# Patient Record
Sex: Female | Born: 1970 | Race: White | Hispanic: No | Marital: Married | Smoking: Never smoker
Health system: Southern US, Community
[De-identification: ages and names within clinical notes are randomized; demographics above are authoritative.]

## PROBLEM LIST (undated history)

## (undated) DIAGNOSIS — I341 Nonrheumatic mitral (valve) prolapse: Secondary | ICD-10-CM

## (undated) DIAGNOSIS — I10 Essential (primary) hypertension: Secondary | ICD-10-CM

## (undated) DIAGNOSIS — F419 Anxiety disorder, unspecified: Secondary | ICD-10-CM

---

## 2004-01-26 ENCOUNTER — Emergency Department (HOSPITAL_COMMUNITY): Admission: EM | Admit: 2004-01-26 | Discharge: 2004-01-26 | Payer: Self-pay | Admitting: Emergency Medicine

## 2011-12-21 ENCOUNTER — Emergency Department (HOSPITAL_COMMUNITY)
Admission: EM | Admit: 2011-12-21 | Discharge: 2011-12-21 | Disposition: A | Discharge status: Home / Self Care | Payer: BC Managed Care – PPO | Attending: Emergency Medicine | Admitting: Emergency Medicine

## 2011-12-21 ENCOUNTER — Encounter (HOSPITAL_COMMUNITY): Payer: Self-pay | Admitting: *Deleted

## 2011-12-21 DIAGNOSIS — I10 Essential (primary) hypertension: Secondary | ICD-10-CM | POA: Insufficient documentation

## 2011-12-21 DIAGNOSIS — R110 Nausea: Secondary | ICD-10-CM | POA: Insufficient documentation

## 2011-12-21 DIAGNOSIS — R000 Tachycardia, unspecified: Secondary | ICD-10-CM | POA: Insufficient documentation

## 2011-12-21 DIAGNOSIS — F419 Anxiety disorder, unspecified: Secondary | ICD-10-CM

## 2011-12-21 DIAGNOSIS — F411 Generalized anxiety disorder: Secondary | ICD-10-CM | POA: Insufficient documentation

## 2011-12-21 DIAGNOSIS — I059 Rheumatic mitral valve disease, unspecified: Secondary | ICD-10-CM | POA: Insufficient documentation

## 2011-12-21 HISTORY — DX: Nonrheumatic mitral (valve) prolapse: I34.1

## 2011-12-21 HISTORY — DX: Essential (primary) hypertension: I10

## 2011-12-21 HISTORY — DX: Anxiety disorder, unspecified: F41.9

## 2011-12-21 LAB — COMPREHENSIVE METABOLIC PANEL
AST: 18 U/L (ref 0–37)
Albumin: 3.7 g/dL (ref 3.5–5.2)
BUN: 7 mg/dL (ref 6–23)
Calcium: 9.1 mg/dL (ref 8.4–10.5)
Chloride: 102 mEq/L (ref 96–112)
Creatinine, Ser: 0.64 mg/dL (ref 0.50–1.10)
Total Bilirubin: 0.5 mg/dL (ref 0.3–1.2)
Total Protein: 7.1 g/dL (ref 6.0–8.3)

## 2011-12-21 LAB — LIPASE, BLOOD: Lipase: 14 U/L (ref 11–59)

## 2011-12-21 LAB — DIFFERENTIAL
Basophils Absolute: 0 10*3/uL (ref 0.0–0.1)
Basophils Relative: 1 % (ref 0–1)
Eosinophils Absolute: 0 10*3/uL (ref 0.0–0.7)
Eosinophils Relative: 1 % (ref 0–5)
Monocytes Absolute: 0.2 10*3/uL (ref 0.1–1.0)
Monocytes Relative: 5 % (ref 3–12)
Neutro Abs: 3.3 10*3/uL (ref 1.7–7.7)

## 2011-12-21 LAB — URINALYSIS, ROUTINE W REFLEX MICROSCOPIC
Glucose, UA: NEGATIVE mg/dL
Ketones, ur: NEGATIVE mg/dL
Nitrite: NEGATIVE
Specific Gravity, Urine: 1.005 (ref 1.005–1.030)
pH: 7.5 (ref 5.0–8.0)

## 2011-12-21 LAB — CBC
HCT: 43.5 % (ref 36.0–46.0)
Hemoglobin: 15.9 g/dL — ABNORMAL HIGH (ref 12.0–15.0)
MCH: 32.3 pg (ref 26.0–34.0)
MCHC: 36.6 g/dL — ABNORMAL HIGH (ref 30.0–36.0)
RDW: 11.9 % (ref 11.5–15.5)

## 2011-12-21 LAB — POCT PREGNANCY, URINE: Preg Test, Ur: NEGATIVE

## 2011-12-21 LAB — URINE MICROSCOPIC-ADD ON

## 2011-12-21 MED ORDER — LORAZEPAM 1 MG PO TABS
1.0000 mg | ORAL_TABLET | Freq: Once | ORAL | Status: AC
  Administered 2011-12-21: 1 mg via ORAL
  Filled 2011-12-21: qty 1

## 2011-12-21 MED ORDER — PANTOPRAZOLE SODIUM 40 MG PO TBEC
40.0000 mg | DELAYED_RELEASE_TABLET | Freq: Every day | ORAL | Status: DC
  Administered 2011-12-21: 40 mg via ORAL
  Filled 2011-12-21: qty 1

## 2011-12-21 MED ORDER — METOCLOPRAMIDE HCL 10 MG PO TABS: 10.0000 mg | ORAL_TABLET | Freq: Four times a day (QID) | ORAL | Status: AC | PRN

## 2011-12-21 MED ORDER — LORAZEPAM 1 MG PO TABS: 1.0000 mg | ORAL_TABLET | Freq: Three times a day (TID) | ORAL | Status: AC | PRN

## 2011-12-21 MED ORDER — ONDANSETRON HCL 8 MG PO TABS
4.0000 mg | ORAL_TABLET | Freq: Once | ORAL | Status: AC
  Administered 2011-12-21: 8 mg via ORAL
  Filled 2011-12-21: qty 1

## 2011-12-21 NOTE — ED Provider Notes (Signed)
History     CSN: [id number]  Arrival date & time 12/21/11  1335   First MD Initiated Contact with Patient 12/21/11 1401      Chief Complaint  Patient presents with  . Nausea  . Hypertension    (Consider location/radiation/quality/duration/timing/severity/associated sxs/prior treatment) HPI 41 year old female had several episodes of nausea today and also an episode 3 days ago. Episodes are fairly brief and only last about a minute. The episode that occurred 3 days ago was associated with lightheadedness but there was no lightheadedness with episodes today. She denies vertigo. Nothing seems to make these episodes whether nothing makes him worse. She became concerned about them and check her blood pressure multiple times and readings were higher than she was comfortable with. The highest blood pressure reading she was 146/99. She did have some burning in her epigastric area but no chest pain. She did not have any vomiting and she denies any diaphoresis. She has been evaluated by a cardiologist and was found.mitral valve prolapse by echocardiogram. Past Medical History  Diagnosis Date  . MVP (mitral valve prolapse)   . Hypertension   . Anxiety     History reviewed. No pertinent past surgical history.  History reviewed. No pertinent family history.  History  Substance Use Topics  . Smoking status: Not on file  . Smokeless tobacco: Not on file  . Alcohol Use: Yes     occ    OB History    Grav Para Term Preterm Abortions TAB SAB Ect Mult Living                  Review of Systems  Allergies  Review of patient's allergies indicates no known allergies.  Home Medications   Current Outpatient Rx  Name Route Sig Dispense Refill  . ALPRAZOLAM 0.25 MG PO TABS Oral Take 0.25 mg by mouth at bedtime as needed.    . ASPIRIN 325 MG PO TABS Oral Take 325 mg by mouth once.    . ASPIRIN 81 MG PO TABS Oral Take 81 mg by mouth daily.    . ATORVASTATIN CALCIUM 20 MG PO TABS Oral Take 20  mg by mouth daily.    . OMEGA-3 FATTY ACIDS 1000 MG PO CAPS Oral Take 1 g by mouth daily.    Marland Kitchen VALSARTAN 80 MG PO TABS Oral Take 80 mg by mouth daily.    Marland Kitchen VERAPAMIL HCL ER 240 MG PO TBCR Oral Take 240 mg by mouth daily.      BP 140/77  Pulse 92  Temp(Src) 98 F (36.7 C) (Oral)  Resp 15  SpO2 95%  Physical Exam 41 year old female who appears very anxious but is in no acute distress. Vital signs are significant for tachycardia with heart rate 115, and moderate hypertension with blood pressure 171/82. Oxygen saturation is 100% which is normal. Head is normocephalic and atraumatic. PERRLA, EOMI. Oropharynx is clear. Neck is nontender and supple without adenopathy. Back is nontender. Lungs are clear without rales, wheezes, or rhonchi. Heart has regular rate and rhythm with a 1-2/6 systolic ejection murmur heard along the sternal border. Abdomen is soft, flat, nontender without masses or hepatosplenomegaly. Extremities have no cyanosis or edema, full range of motion is present. Skin is warm and dry without rash. Neurologic: He is awake, alert, oriented. Cranial nerves are intact. There no focal motor or sensory deficits. Psychiatric: She is very anxious and it is difficult to get her focused to answer questions as she tends to go on tangents  that are important to her. ED Course  Procedures (including critical care time)  Results for orders placed during the hospital encounter of 12/21/11  CBC      Component Value Range   WBC 4.6  4.0 - 10.5 (K/uL)   RBC 4.93  3.87 - 5.11 (MIL/uL)   Hemoglobin 15.9 (*) 12.0 - 15.0 (g/dL)   HCT 16.1  09.6 - 04.5 (%)   MCV 88.2  78.0 - 100.0 (fL)   MCH 32.3  26.0 - 34.0 (pg)   MCHC 36.6 (*) 30.0 - 36.0 (g/dL)   RDW 40.9  81.1 - 91.4 (%)   Platelets 217  150 - 400 (K/uL)  DIFFERENTIAL      Component Value Range   Neutrophils Relative 71  43 - 77 (%)   Neutro Abs 3.3  1.7 - 7.7 (K/uL)   Lymphocytes Relative 22  12 - 46 (%)   Lymphs Abs 1.0  0.7 - 4.0 (K/uL)     Monocytes Relative 5  3 - 12 (%)   Monocytes Absolute 0.2  0.1 - 1.0 (K/uL)   Eosinophils Relative 1  0 - 5 (%)   Eosinophils Absolute 0.0  0.0 - 0.7 (K/uL)   Basophils Relative 1  0 - 1 (%)   Basophils Absolute 0.0  0.0 - 0.1 (K/uL)  COMPREHENSIVE METABOLIC PANEL      Component Value Range   Sodium 136  135 - 145 (mEq/L)   Potassium 4.1  3.5 - 5.1 (mEq/L)   Chloride 102  96 - 112 (mEq/L)   CO2 23  19 - 32 (mEq/L)   Glucose, Bld 126 (*) 70 - 99 (mg/dL)   BUN 7  6 - 23 (mg/dL)   Creatinine, Ser 7.82  0.50 - 1.10 (mg/dL)   Calcium 9.1  8.4 - 95.6 (mg/dL)   Total Protein 7.1  6.0 - 8.3 (g/dL)   Albumin 3.7  3.5 - 5.2 (g/dL)   AST 18  0 - 37 (U/L)   ALT 18  0 - 35 (U/L)   Alkaline Phosphatase 70  39 - 117 (U/L)   Total Bilirubin 0.5  0.3 - 1.2 (mg/dL)   GFR calc non Af Amer >90  >90 (mL/min)   GFR calc Af Amer >90  >90 (mL/min)  LIPASE, BLOOD      Component Value Range   Lipase 14  11 - 59 (U/L)  URINALYSIS, ROUTINE W REFLEX MICROSCOPIC      Component Value Range   Color, Urine YELLOW  YELLOW    APPearance CLEAR  CLEAR    Specific Gravity, Urine 1.005  1.005 - 1.030    pH 7.5  5.0 - 8.0    Glucose, UA NEGATIVE  NEGATIVE (mg/dL)   Hgb urine dipstick NEGATIVE  NEGATIVE    Bilirubin Urine NEGATIVE  NEGATIVE    Ketones, ur NEGATIVE  NEGATIVE (mg/dL)   Protein, ur NEGATIVE  NEGATIVE (mg/dL)   Urobilinogen, UA 0.2  0.0 - 1.0 (mg/dL)   Nitrite NEGATIVE  NEGATIVE    Leukocytes, UA SMALL (*) NEGATIVE   POCT PREGNANCY, URINE      Component Value Range   Preg Test, Ur NEGATIVE  NEGATIVE   URINE MICROSCOPIC-ADD ON      Component Value Range   Squamous Epithelial / LPF FEW (*) RARE    WBC, UA 0-2  <3 (WBC/hpf)   She's feeling much better after oral Zofran and oral Ativan. It is not clear what was causing her episodes of nausea  but she will be sent home with a prescription for an antiemetic. She's also given a prescription for Ativan to use as needed. She is advised to monitor her  blood pressure on regular basis and to discuss treatment options for blood pressure with her physician. She has requested referral to a cardiologist in [place] and was given the phone number for [facility].  1. Nausea   2. Anxiety   3. Hypertension       MDM  Episodic nausea of uncertain etiology. Labile hypertension. She demonstrates extreme anxiety which may be playing a role as well.        Dione Booze, MD 12/21/11 1651

## 2011-12-21 NOTE — ED Notes (Signed)
Old and new EKG given to EDP at 13:40

## 2011-12-21 NOTE — ED Notes (Signed)
Reports having intermittent episodes of not feeling well today, having waves of nausea over her. Denies any cp but reports being hypertensive pta. Pt thought it may be due to anxiety and took 2 xanax pta, tearful at triage. ekg done.

## 2011-12-21 NOTE — Discharge Instructions (Signed)
Anxiety and Panic Attacks Your caregiver has informed you that you are having an anxiety or panic attack. There may be many forms of this. Most of the time these attacks come suddenly and without warning. They come at any time of day, including periods of sleep, and at any time of life. They may be strong and unexplained. Although panic attacks are very scary, they are physically harmless. Sometimes the cause of your anxiety is not known. Anxiety is a protective mechanism of the body in its fight or flight mechanism. Most of these perceived danger situations are actually nonphysical situations (such as anxiety over losing a job). CAUSES  The causes of an anxiety or panic attack are many. Panic attacks may occur in otherwise healthy people given a certain set of circumstances. There may be a genetic cause for panic attacks. Some medications may also have anxiety as a side effect. SYMPTOMS  Some of the most common feelings are:  Intense terror.   Dizziness, feeling faint.   Hot and cold flashes.   Fear of going crazy.   Feelings that nothing is real.   Sweating.   Shaking.   Chest pain or a fast heartbeat (palpitations).   Smothering, choking sensations.   Feelings of impending doom and that death is near.   Tingling of extremities, this may be from over-breathing.   Altered reality (derealization).   Being detached from yourself (depersonalization).  Several symptoms can be present to make up anxiety or panic attacks. DIAGNOSIS  The evaluation by your caregiver will depend on the type of symptoms you are experiencing. The diagnosis of anxiety or panic attack is made when no physical illness can be determined to be a cause of the symptoms. TREATMENT  Treatment to prevent anxiety and panic attacks may include:  Avoidance of circumstances that cause anxiety.   Reassurance and relaxation.   Regular exercise.   Relaxation therapies, such as yoga.   Psychotherapy with a  psychiatrist or therapist.   Avoidance of caffeine, alcohol and illegal drugs.   Prescribed medication.  SEEK IMMEDIATE MEDICAL CARE IF:   You experience panic attack symptoms that are different than your usual symptoms.   You have any worsening or concerning symptoms.  Document Released: 08/18/2005 Document Revised: 08/07/2011 Document Reviewed: 12/20/2009 [facility] Patient Information 2012 [place], [place].  Nausea and Vomiting Nausea is a sick feeling that often comes before throwing up (vomiting). Vomiting is a reflex where stomach contents come out of your mouth. Vomiting can cause severe loss of body fluids (dehydration). Children and elderly adults can become dehydrated quickly, especially if they also have diarrhea. Nausea and vomiting are symptoms of a condition or disease. It is important to find the cause of your symptoms. CAUSES   Direct irritation of the stomach lining. This irritation can result from increased acid production (gastroesophageal reflux disease), infection, food poisoning, taking certain medicines (such as nonsteroidal anti-inflammatory drugs), alcohol use, or tobacco use.   Signals from the brain.These signals could be caused by a headache, heat exposure, an inner ear disturbance, increased pressure in the brain from injury, infection, a tumor, or a concussion, pain, emotional stimulus, or metabolic problems.   An obstruction in the gastrointestinal tract (bowel obstruction).   Illnesses such as diabetes, hepatitis, gallbladder problems, appendicitis, kidney problems, cancer, sepsis, atypical symptoms of a heart attack, or eating disorders.   Medical treatments such as chemotherapy and radiation.   Receiving medicine that makes you sleep (general anesthetic) during surgery.  DIAGNOSIS Your caregiver  may ask for tests to be done if the problems do not improve after a few days. Tests may also be done if symptoms are severe or if the reason for the nausea and  vomiting is not clear. Tests may include:  Urine tests.   Blood tests.   Stool tests.   Cultures (to look for evidence of infection).   X-rays or other imaging studies.  Test results can help your caregiver make decisions about treatment or the need for additional tests. TREATMENT You need to stay well hydrated. Drink frequently but in small amounts.You may wish to drink water, sports drinks, clear broth, or eat frozen ice pops or gelatin dessert to help stay hydrated.When you eat, eating slowly may help prevent nausea.There are also some antinausea medicines that may help prevent nausea. HOME CARE INSTRUCTIONS   Take all medicine as directed by your caregiver.   If you do not have an appetite, do not force yourself to eat. However, you must continue to drink fluids.   If you have an appetite, eat a normal diet unless your caregiver tells you differently.   Eat a variety of complex carbohydrates (rice, wheat, potatoes, bread), lean meats, yogurt, fruits, and vegetables.   Avoid high-fat foods because they are more difficult to digest.   Drink enough water and fluids to keep your urine clear or pale yellow.   If you are dehydrated, ask your caregiver for specific rehydration instructions. Signs of dehydration may include:   Severe thirst.   Dry lips and mouth.   Dizziness.   Dark urine.   Decreasing urine frequency and amount.   Confusion.   Rapid breathing or pulse.  SEEK IMMEDIATE MEDICAL CARE IF:   You have blood or brown flecks (like coffee grounds) in your vomit.   You have black or bloody stools.   You have a severe headache or stiff neck.   You are confused.   You have severe abdominal pain.   You have chest pain or trouble breathing.   You do not urinate at least once every 8 hours.   You develop cold or clammy skin.   You continue to vomit for longer than 24 to 48 hours.   You have a fever.  MAKE SURE YOU:   Understand these instructions.     Will watch your condition.   Will get help right away if you are not doing well or get worse.  Document Released: 08/18/2005 Document Revised: 08/07/2011 Document Reviewed: 01/15/2011 [facility] Patient Information 2012 [place], [place].  Lorazepam tablets What is this medicine? LORAZEPAM (lor A ze pam) is a benzodiazepine. It is used to treat anxiety. This medicine may be used for other purposes; ask your health care provider or pharmacist if you have questions. What should I tell my health care provider before I take this medicine? They need to know if you have any of these conditions: -alcohol or drug abuse problem -bipolar disorder, depression, psychosis or other mental health condition -glaucoma -kidney or liver disease -lung disease or breathing difficulties -myasthenia gravis -Parkinson's disease -seizures or a history of seizures -suicidal thoughts -an unusual or allergic reaction to lorazepam, other benzodiazepines, foods, dyes, or preservatives -pregnant or trying to get pregnant -breast-feeding How should I use this medicine? Take this medicine by mouth with a glass of water. Follow the directions on the prescription label. If it upsets your stomach, take it with food or milk. Take your medicine at regular intervals. Do not take it more often than  directed. Do not stop taking except on the advice of your doctor or health care professional. Talk to your pediatrician regarding the use of this medicine in children. Special care may be needed. Overdosage: If you think you have taken too much of this medicine contact a poison control center or emergency room at once. NOTE: This medicine is only for you. Do not share this medicine with others. What if I miss a dose? If you miss a dose, take it as soon as you can. If it is almost time for your next dose, take only that dose. Do not take double or extra doses. What may interact with this medicine? -barbiturate medicines for  inducing sleep or treating seizures, like phenobarbital -clozapine -medicines for depression, mental problems or psychiatric disturbances -medicines for sleep -phenytoin -probenecid -theophylline -valproic acid This list may not describe all possible interactions. Give your health care provider a list of all the medicines, herbs, non-prescription drugs, or dietary supplements you use. Also tell them if you smoke, drink alcohol, or use illegal drugs. Some items may interact with your medicine. What should I watch for while using this medicine? Visit your doctor or health care professional for regular checks on your progress. Your body may become dependent on this medicine, ask your doctor or health care professional if you still need to take it. However, if you have been taking this medicine regularly for some time, do not suddenly stop taking it. You must gradually reduce the dose or you may get severe side effects. Ask your doctor or health care professional for advice before increasing or decreasing the dose. Even after you stop taking this medicine it can still affect your body for several days. You may get drowsy or dizzy. Do not drive, use machinery, or do anything that needs mental alertness until you know how this medicine affects you. To reduce the risk of dizzy and fainting spells, do not stand or sit up quickly, especially if you are an older patient. Alcohol may increase dizziness and drowsiness. Avoid alcoholic drinks. Do not treat yourself for coughs, colds or allergies without asking your doctor or health care professional for advice. Some ingredients can increase possible side effects. What side effects may I notice from receiving this medicine? Side effects that you should report to your doctor or health care professional as soon as possible: -changes in vision -confusion -depression -mood changes, excitability or aggressive behavior -movement difficulty, staggering or jerky  movements -muscle cramps -restlessness -weakness or tiredness Side effects that usually do not require medical attention (report to your doctor or health care professional if they continue or are bothersome): -constipation or diarrhea -difficulty sleeping, nightmares -dizziness, drowsiness -headache -nausea, vomiting This list may not describe all possible side effects. Call your doctor for medical advice about side effects. You may report side effects to FDA at 1-800-FDA-1088. Where should I keep my medicine? Keep out of the reach of children. This medicine can be abused. Keep your medicine in a safe place to protect it from theft. Do not share this medicine with anyone. Selling or giving away this medicine is dangerous and against the law. Store at room temperature between 20 and 25 degrees C (68 and 77 degrees F). Protect from light. Keep container tightly closed. Throw away any unused medicine after the expiration date. NOTE: This sheet is a summary. It may not cover all possible information. If you have questions about this medicine, talk to your doctor, pharmacist, or health care provider.  2012,  [employer]vier/Gold Standard. (02/18/2008 2:58:20 PM)  Metoclopramide tablets What is this medicine? METOCLOPRAMIDE (met oh kloe PRA mide) is used to treat the symptoms of gastroesophageal reflux disease (GERD) like heartburn. It is also used to treat people with slow emptying of the stomach and intestinal tract. This medicine may be used for other purposes; ask your health care provider or pharmacist if you have questions. What should I tell my health care provider before I take this medicine? They need to know if you have any of these conditions: -breast cancer -depression -diabetes -heart failure -high blood pressure -kidney disease -liver disease -Parkinson's disease or a movement disorder -pheochromocytoma -seizures -stomach obstruction, bleeding, or perforation -an unusual or allergic  reaction to metoclopramide, procainamide, sulfites, other medicines, foods, dyes, or preservatives -pregnant or trying to get pregnant -breast-feeding How should I use this medicine? Take this medicine by mouth with a glass of water. Follow the directions on the prescription label. Take this medicine on an empty stomach, about 30 minutes before eating. Take your doses at regular intervals. Do not take your medicine more often than directed. Do not stop taking except on the advice of your doctor or health care professional. A special MedGuide will be given to you by the pharmacist with each prescription and refill. Be sure to read this information carefully each time. Talk to your pediatrician regarding the use of this medicine in children. Special care may be needed. Overdosage: If you think you have taken too much of this medicine contact a poison control center or emergency room at once. NOTE: This medicine is only for you. Do not share this medicine with others. What if I miss a dose? If you miss a dose, take it as soon as you can. If it is almost time for your next dose, take only that dose. Do not take double or extra doses. What may interact with this medicine? -acetaminophen -cyclosporine -digoxin -medicines for blood pressure -medicines for diabetes, including insulin -medicines for hay fever and other allergies -medicines for depression, especially an Monoamine Oxidase Inhibitor (MAOI) -medicines for Parkinson's disease, like levodopa -medicines for sleep or for pain -tetracycline This list may not describe all possible interactions. Give your health care provider a list of all the medicines, herbs, non-prescription drugs, or dietary supplements you use. Also tell them if you smoke, drink alcohol, or use illegal drugs. Some items may interact with your medicine. What should I watch for while using this medicine? It may take a few weeks for your stomach condition to start to get  better. However, do not take this medicine for longer than 12 weeks. The longer you take this medicine, and the more you take it, the greater your chances are of developing serious side effects. If you are an elderly patient, a female patient, or you have diabetes, you may be at an increased risk for side effects from this medicine. Contact your doctor immediately if you start having movements you cannot control such as lip smacking, rapid movements of the tongue, involuntary or uncontrollable movements of the eyes, head, arms and legs, or muscle twitches and spasms. Patients and their families should watch out for worsening depression or thoughts of suicide. Also watch out for any sudden or severe changes in feelings such as feeling anxious, agitated, panicky, irritable, hostile, aggressive, impulsive, severely restless, overly excited and hyperactive, or not being able to sleep. If this happens, especially at the beginning of treatment or after a change in dose, call your doctor. Do  not treat yourself for high fever. Ask your doctor or health care professional for advice. You may get drowsy or dizzy. Do not drive, use machinery, or do anything that needs mental alertness until you know how this drug affects you. Do not stand or sit up quickly, especially if you are an older patient. This reduces the risk of dizzy or fainting spells. Alcohol can make you more drowsy and dizzy. Avoid alcoholic drinks. What side effects may I notice from receiving this medicine? Side effects that you should report to your doctor or health care professional as soon as possible: -allergic reactions like skin rash, itching or hives, swelling of the face, lips, or tongue -abnormal production of milk in females -breast enlargement in both males and females -change in the way you walk -difficulty moving, speaking or swallowing -drooling, lip smacking, or rapid movements of the tongue -excessive sweating -fever -involuntary or  uncontrollable movements of the eyes, head, arms and legs -irregular heartbeat or palpitations -muscle twitches and spasms -unusually weak or tired Side effects that usually do not require medical attention (report to your doctor or health care professional if they continue or are bothersome): -change in sex drive or performance -depressed mood -diarrhea -difficulty sleeping -headache -menstrual changes -restless or nervous This list may not describe all possible side effects. Call your doctor for medical advice about side effects. You may report side effects to FDA at 1-800-FDA-1088. Where should I keep my medicine? Keep out of the reach of children. Store at room temperature between 20 and 25 degrees C (68 and 77 degrees F). Protect from light. Keep container tightly closed. Throw away any unused medicine after the expiration date. NOTE: This sheet is a summary. It may not cover all possible information. If you have questions about this medicine, talk to your doctor, pharmacist, or health care provider.  2012, [employer]vier/Gold Standard. (04/12/2008 4:30:05 PM)

## 2011-12-21 NOTE — ED Notes (Signed)
Pt continues to be very anxious but laughing. Husband at bedside.

## 2014-09-08 ENCOUNTER — Encounter (HOSPITAL_COMMUNITY): Payer: Self-pay | Admitting: Emergency Medicine

## 2014-09-08 DIAGNOSIS — F419 Anxiety disorder, unspecified: Secondary | ICD-10-CM | POA: Insufficient documentation

## 2014-09-08 DIAGNOSIS — Z79899 Other long term (current) drug therapy: Secondary | ICD-10-CM | POA: Insufficient documentation

## 2014-09-08 DIAGNOSIS — I10 Essential (primary) hypertension: Secondary | ICD-10-CM | POA: Diagnosis not present

## 2014-09-08 DIAGNOSIS — Z7951 Long term (current) use of inhaled steroids: Secondary | ICD-10-CM | POA: Diagnosis not present

## 2014-09-08 DIAGNOSIS — R079 Chest pain, unspecified: Secondary | ICD-10-CM | POA: Diagnosis not present

## 2014-09-08 DIAGNOSIS — I341 Nonrheumatic mitral (valve) prolapse: Secondary | ICD-10-CM | POA: Diagnosis not present

## 2014-09-08 DIAGNOSIS — Z7982 Long term (current) use of aspirin: Secondary | ICD-10-CM | POA: Insufficient documentation

## 2014-09-08 DIAGNOSIS — N644 Mastodynia: Secondary | ICD-10-CM | POA: Diagnosis present

## 2014-09-08 LAB — BASIC METABOLIC PANEL
ANION GAP: 14 (ref 5–15)
BUN: 7 mg/dL (ref 6–23)
CO2: 22 mmol/L (ref 19–32)
Calcium: 9 mg/dL (ref 8.4–10.5)
Chloride: 100 mEq/L (ref 96–112)
Creatinine, Ser: 0.67 mg/dL (ref 0.50–1.10)
GFR calc Af Amer: >90 mL/min (ref >90)
GFR calc non Af Amer: >90 mL/min (ref >90)
Glucose, Bld: 104 mg/dL — ABNORMAL HIGH (ref 70–99)
Potassium: 3.9 mmol/L (ref 3.5–5.1)
Sodium: 136 mmol/L (ref 135–145)

## 2014-09-08 LAB — I-STAT TROPONIN, ED: TROPONIN I, POC: 0 ng/mL (ref 0.00–0.08)

## 2014-09-08 NOTE — ED Notes (Signed)
Pt. reports left lateral breast discomfort ' twinge"  onset this evening , denies injury , respirations unlabored , pt. states intermittent hot flashes / perimenopause.

## 2014-09-09 ENCOUNTER — Emergency Department (HOSPITAL_COMMUNITY)
Admission: EM | Admit: 2014-09-09 | Discharge: 2014-09-09 | Disposition: A | Discharge status: Home / Self Care | Payer: 59 | Attending: Emergency Medicine | Admitting: Emergency Medicine

## 2014-09-09 ENCOUNTER — Emergency Department (HOSPITAL_COMMUNITY): Payer: 59

## 2014-09-09 DIAGNOSIS — R079 Chest pain, unspecified: Secondary | ICD-10-CM

## 2014-09-09 LAB — I-STAT TROPONIN, ED: Troponin i, poc: 0 ng/mL (ref 0.00–0.08)

## 2014-09-09 LAB — CBC
HCT: 41.8 % (ref 36.0–46.0)
Hemoglobin: 15.3 g/dL — ABNORMAL HIGH (ref 12.0–15.0)
MCH: 32.4 pg (ref 26.0–34.0)
MCHC: 36.6 g/dL — ABNORMAL HIGH (ref 30.0–36.0)
MCV: 88.6 fL (ref 78.0–100.0)
PLATELETS: 218 10*3/uL (ref 150–400)
RBC: 4.72 MIL/uL (ref 3.87–5.11)
RDW: 11.8 % (ref 11.5–15.5)
WBC: 5.6 10*3/uL (ref 4.0–10.5)

## 2014-09-09 NOTE — ED Notes (Signed)
Discharge instructions reviewed, voiced understanding.

## 2014-09-09 NOTE — Discharge Instructions (Signed)
If you were given medicines take as directed.  If you are on coumadin or contraceptives realize their levels and effectiveness is altered by many different medicines.  If you have any reaction (rash, tongues swelling, other) to the medicines stop taking and see a physician.   Please follow up as directed and return to the ER or see a physician for new or worsening symptoms.  Thank you. Filed Vitals:   09/08/14 2242 09/09/14 0014 09/09/14 0015 09/09/14 0030  BP: 169/95 145/77 143/80 131/74  Pulse: 115 98 100 97  Temp: 98 F (36.7 C)     TempSrc: Oral     Resp: 18 16 14 20   SpO2: 97% 99% 98% 97%    Chest Pain (Nonspecific) It is often hard to give a specific diagnosis for the cause of chest pain. There is always a chance that your pain could be related to something serious, such as a heart attack or a blood clot in the lungs. You need to follow up with your health care provider for further evaluation. CAUSES   Heartburn.  Pneumonia or bronchitis.  Anxiety or stress.  Inflammation around your heart (pericarditis) or lung (pleuritis or pleurisy).  A blood clot in the lung.  A collapsed lung (pneumothorax). It can develop suddenly on its own (spontaneous pneumothorax) or from trauma to the chest.  Shingles infection (herpes zoster virus). The chest wall is composed of bones, muscles, and cartilage. Any of these can be the source of the pain.  The bones can be bruised by injury.  The muscles or cartilage can be strained by coughing or overwork.  The cartilage can be affected by inflammation and become sore (costochondritis). DIAGNOSIS  Lab tests or other studies may be needed to find the cause of your pain. Your health care provider may have you take a test called an ambulatory electrocardiogram (ECG). An ECG records your heartbeat patterns over a 24-hour period. You may also have other tests, such as:  Transthoracic echocardiogram (TTE). During echocardiography, sound waves are  used to evaluate how blood flows through your heart.  Transesophageal echocardiogram (TEE).  Cardiac monitoring. This allows your health care provider to monitor your heart rate and rhythm in real time.  Holter monitor. This is a portable device that records your heartbeat and can help diagnose heart arrhythmias. It allows your health care provider to track your heart activity for several days, if needed.  Stress tests by exercise or by giving medicine that makes the heart beat faster. TREATMENT   Treatment depends on what may be causing your chest pain. Treatment may include:  Acid blockers for heartburn.  Anti-inflammatory medicine.  Pain medicine for inflammatory conditions.  Antibiotics if an infection is present.  You may be advised to change lifestyle habits. This includes stopping smoking and avoiding alcohol, caffeine, and chocolate.  You may be advised to keep your head raised (elevated) when sleeping. This reduces the chance of acid going backward from your stomach into your esophagus. Most of the time, nonspecific chest pain will improve within 2-3 days with rest and mild pain medicine.  HOME CARE INSTRUCTIONS   If antibiotics were prescribed, take them as directed. Finish them even if you start to feel better.  For the next few days, avoid physical activities that bring on chest pain. Continue physical activities as directed.  Do not use any tobacco products, including cigarettes, chewing tobacco, or electronic cigarettes.  Avoid drinking alcohol.  Only take medicine as directed by your health  care provider.  Follow your health care provider's suggestions for further testing if your chest pain does not go away.  Keep any follow-up appointments you made. If you do not go to an appointment, you could develop lasting (chronic) problems with pain. If there is any problem keeping an appointment, call to reschedule. SEEK MEDICAL CARE IF:   Your chest pain does not go  away, even after treatment.  You have a rash with blisters on your chest.  You have a fever. SEEK IMMEDIATE MEDICAL CARE IF:   You have increased chest pain or pain that spreads to your arm, neck, jaw, back, or abdomen.  You have shortness of breath.  You have an increasing cough, or you cough up blood.  You have severe back or abdominal pain.  You feel nauseous or vomit.  You have severe weakness.  You faint.  You have chills. This is an emergency. Do not wait to see if the pain will go away. Get medical help at once. Call your local emergency services (911 in [place] Do not drive yourself to the hospital. MAKE SURE YOU:   Understand these instructions.  Will watch your condition.  Will get help right away if you are not doing well or get worse. Document Released: 05/28/2005 Document Revised: 08/23/2013 Document Reviewed: 03/23/2008 [facility] Patient Information 2015 [place], [place]. This information is not intended to replace advice given to you by your health care provider. Make sure you discuss any questions you have with your health care provider.

## 2014-09-09 NOTE — ED Provider Notes (Signed)
CSN: [id number]     Arrival date & time 09/08/14  2239 History  This chart was scribed for Samantha Clonts, MD by Samantha Shannon, ED Scribe. This patient was seen in room D36C/D36C and the patient's care was started at 12:19 AM.    Chief Complaint  Patient presents with  . Breast Pain   The history is provided by the patient. No language interpreter was used.   HPI Comments: Samantha Shannon is a 44 y.o. female who presents to the Emergency Department complaining of intermittent moderate left lateral breast pain that started about 12 hours ago. She reports that she has been having "twinges" that may be related to anxiety. She states that she has been having intermittent hot flashes starting 12 hours ago. She states that the episodes of pain are brief with no modifying factors. She states that she has a hx of MVP, HTN, and hypercholestrolemia.  She denies any hx of smoking. She denies any leg swelling, SOB, abdominal pain, headache, visual disturbance.   Past Medical History  Diagnosis Date  . MVP (mitral valve prolapse)   . Hypertension   . Anxiety    History reviewed. No pertinent past surgical history. No family history on file. History  Substance Use Topics  . Smoking status: Never Smoker   . Smokeless tobacco: Not on file  . Alcohol Use: Yes     Comment: occ   OB History    No data available     Review of Systems  Eyes: Negative for visual disturbance.  Respiratory: Negative for shortness of breath.   Cardiovascular: Positive for chest pain. Negative for leg swelling.  Gastrointestinal: Negative for abdominal pain.  Neurological: Negative for headaches.  Psychiatric/Behavioral: The patient is nervous/anxious.   All other systems reviewed and are negative.   Allergies  Review of patient's allergies indicates no known allergies.  Home Medications   Prior to Admission medications   Medication Sig Start Date End Date Taking? Authorizing Provider  ALPRAZolam Duanne Moron) 0.25 MG  tablet Take 0.25 mg by mouth at bedtime as needed for anxiety or sleep.    Yes Historical Provider, MD  atorvastatin (LIPITOR) 20 MG tablet Take 20 mg by mouth daily.   Yes Historical Provider, MD  azelastine (ASTELIN) 0.1 % nasal spray Place 1 spray into both nostrils daily. Use in each nostril as directed   Yes Historical Provider, MD  fluticasone (FLONASE) 50 MCG/ACT nasal spray Place 2 sprays into both nostrils daily.   Yes Historical Provider, MD  guaiFENesin (MUCINEX) 600 MG 12 hr tablet Take 600 mg by mouth at bedtime.   Yes Historical Provider, MD  montelukast (SINGULAIR) 10 MG tablet Take 10 mg by mouth daily as needed (allergies).   Yes Historical Provider, MD  valsartan (DIOVAN) 160 MG tablet Take 160 mg by mouth daily.   Yes Historical Provider, MD  verapamil (CALAN-SR) 240 MG CR tablet Take 240 mg by mouth daily.   Yes Historical Provider, MD  aspirin 325 MG tablet Take 325 mg by mouth once.    Historical Provider, MD  aspirin 81 MG tablet Take 81 mg by mouth daily.    Historical Provider, MD  fish oil-omega-3 fatty acids 1000 MG capsule Take 1 g by mouth daily.    Historical Provider, MD  metoCLOPramide (REGLAN) 10 MG tablet Take 1 tablet (10 mg total) by mouth every 6 (six) hours as needed (nausea). 9/92/42 02/06/33  Delora Fuel, MD  valsartan (DIOVAN) 80 MG tablet Take 80  mg by mouth daily.    Historical Provider, MD   BP 143/80 mmHg  Pulse 100  Temp(Src) 98 F (36.7 C) (Oral)  Resp 14  SpO2 98% Physical Exam  Constitutional: She is oriented to person, place, and time. She appears well-developed and well-nourished. No distress.  HENT:  Head: Normocephalic and atraumatic.  Neck: Neck supple. No tracheal deviation present.  Cardiovascular: Normal rate.   +2 left sternal border. Mitral valve prolapse.   Pulmonary/Chest: Effort normal and breath sounds normal. No respiratory distress. She has no wheezes. She has no rales.  Musculoskeletal: Normal range of motion.  Left lateral  breast: No rash noted. No signs of infection.  No calf TTP. No leg swelling.   Neurological: She is alert and oriented to person, place, and time.  Skin: Skin is warm and dry.  Psychiatric: She has a normal mood and affect. Her behavior is normal.  Nursing note and vitals reviewed.   ED Course  Procedures (including critical care time) DIAGNOSTIC STUDIES: Oxygen Saturation is 98% on RA, normal by my interpretation.    COORDINATION OF CARE: 12:23 AM- Pt advised of plan for treatment which include labs and pt agrees.  Labs Review Labs Reviewed  CBC - Abnormal; Notable for the following:    Hemoglobin 15.3 (*)    MCHC 36.6 (*)    All other components within normal limits  BASIC METABOLIC PANEL - Abnormal; Notable for the following:    Glucose, Bld 104 (*)    All other components within normal limits  I-STAT TROPOININ, ED  Randolm Idol, ED    Imaging Review Dg Chest 2 View  09/09/2014   CLINICAL DATA:  Left-sided chest pain lateral to the left breast today.  EXAM: CHEST  2 VIEW  COMPARISON:  None.  FINDINGS: The heart size and mediastinal contours are within normal limits. Both lungs are clear. The visualized skeletal structures are unremarkable.  IMPRESSION: No active cardiopulmonary disease.   Electronically Signed   By: Lucienne Capers M.D.   On: 09/09/2014 02:20     EKG Interpretation   Date/Time:  Friday September 08 2014 22:45:59 EST Ventricular Rate:  98 PR Interval:  118 QRS Duration: 82 QT Interval:  332 QTC Calculation: 423 R Axis:   54 Text Interpretation:  Normal sinus rhythm Normal ECG Confirmed by Zuleika Gallus   MD, Hannahmarie Asberry ([id number]) on 09/09/2014 12:24:31 AM      MDM   Final diagnoses:  Chest pain, unspecified chest pain type  Chest pain   I personally performed the services described in this documentation, which was scribed in my presence. The recorded information has been reviewed and is accurate.  Patient presents after very brief atypical episode of left  lateral chest discomfort and flushed sensation. Patient denies recent exertional symptoms, cardiac history, blood clot risk factors. Patient has no symptoms on reassessment. Heart rate intermittently goes up however she gets anxious and she has had this before. Discussed close outpatient follow-up with the patient and husband, they're comfortable with the plan. Low heart score. Nl ekg Results and differential diagnosis were discussed with the patient/parent/guardian. Close follow up outpatient was discussed, comfortable with the plan.   Medications - No data to display  Filed Vitals:   09/09/14 0015 09/09/14 0030 09/09/14 0245 09/09/14 0246  BP: 143/80 131/74 132/69 132/69  Pulse: 100 97 112 105  Temp:      TempSrc:      Resp: 14 20 24 17   SpO2: 98% 97% 95% 95%  Final diagnoses:  Chest pain, unspecified chest pain type  Chest pain        Samantha Clonts, MD 09/09/14 [id number]

## 2014-09-09 NOTE — ED Notes (Signed)
Patient reports tonight she started with left breast pain that is at the bra line.  Also stated she started with a "hot flash" to the epigastric area that went to the jaw line early today.  Denies SOB, N/V, diaphoresis.  Also stated that she should be starting her period now if she was going to get one.  Has missed her period off and on since last June.

## 2014-09-09 NOTE — ED Notes (Signed)
Assisted to ambulate to bathroom.  Good steady gait noted.  No distress noted at this time.  Family member at the bedside.

## 2019-10-04 ENCOUNTER — Other Ambulatory Visit: Payer: Self-pay | Admitting: Obstetrics and Gynecology

## 2019-10-04 DIAGNOSIS — R102 Pelvic and perineal pain: Secondary | ICD-10-CM

## 2019-10-06 ENCOUNTER — Ambulatory Visit
Admission: RE | Admit: 2019-10-06 | Discharge: 2019-10-06 | Disposition: A | Discharge status: Home / Self Care | Payer: Commercial Managed Care - PPO | Source: Ambulatory Visit | Attending: Obstetrics and Gynecology | Admitting: Obstetrics and Gynecology

## 2019-10-06 DIAGNOSIS — R102 Pelvic and perineal pain: Secondary | ICD-10-CM

## 2019-11-29 ENCOUNTER — Other Ambulatory Visit: Payer: Self-pay | Admitting: Obstetrics and Gynecology

## 2019-11-29 DIAGNOSIS — N83202 Unspecified ovarian cyst, left side: Secondary | ICD-10-CM

## 2019-11-30 ENCOUNTER — Ambulatory Visit
Admission: RE | Admit: 2019-11-30 | Discharge: 2019-11-30 | Disposition: A | Discharge status: Home / Self Care | Payer: Commercial Managed Care - PPO | Source: Ambulatory Visit | Attending: Obstetrics and Gynecology | Admitting: Obstetrics and Gynecology

## 2019-11-30 DIAGNOSIS — N83202 Unspecified ovarian cyst, left side: Secondary | ICD-10-CM

## 2020-08-17 IMAGING — US US PELVIS COMPLETE WITH TRANSVAGINAL
1 series · 13 of 25 positions shown · non-contrast
Comparison: None

CLINICAL DATA: Pelvic pain in a female, postmenopausal

EXAM:
TRANSABDOMINAL AND TRANSVAGINAL ULTRASOUND OF PELVIS
TECHNIQUE: Both transabdominal and transvaginal ultrasound examinations of the
pelvis were performed. Transabdominal technique was performed for
global imaging of the pelvis including uterus, ovaries, adnexal
regions, and pelvic cul-de-sac. It was necessary to proceed with
endovaginal exam following the transabdominal exam to visualize the
lower uterine segment and RIGHT ovary.

[Series 1: us pelvis complete with transvaginal · 0.25mm/px · 13 of 65 slices shown]
[im 1/65]
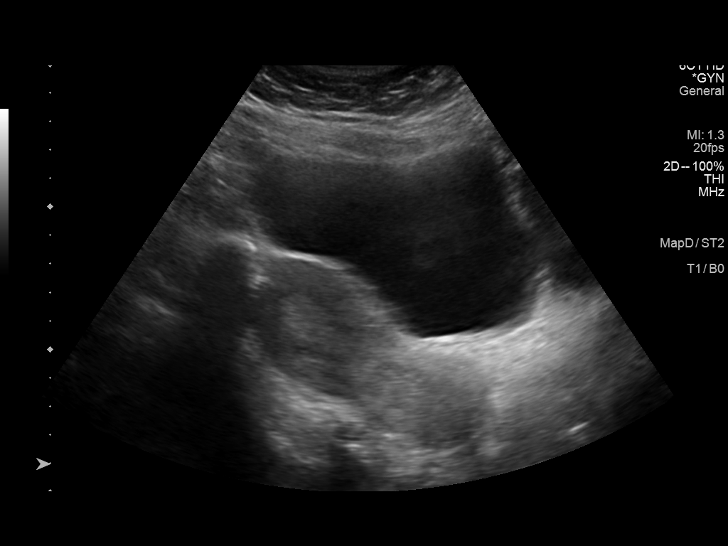
[im 6/65]
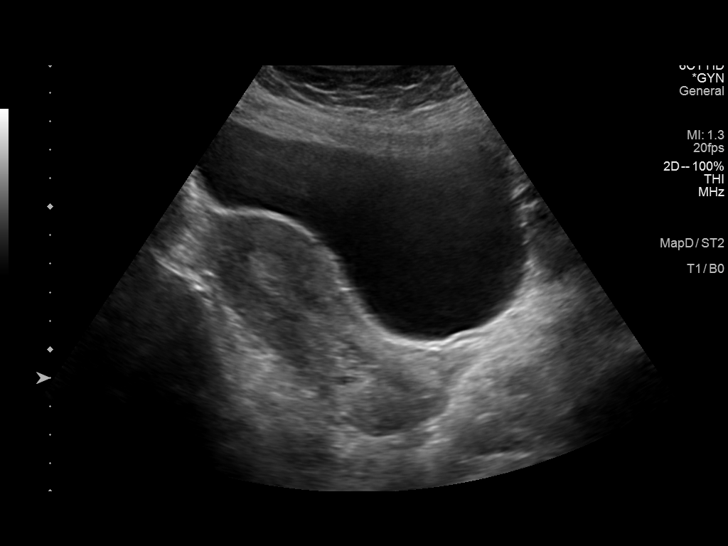
[im 11/65]
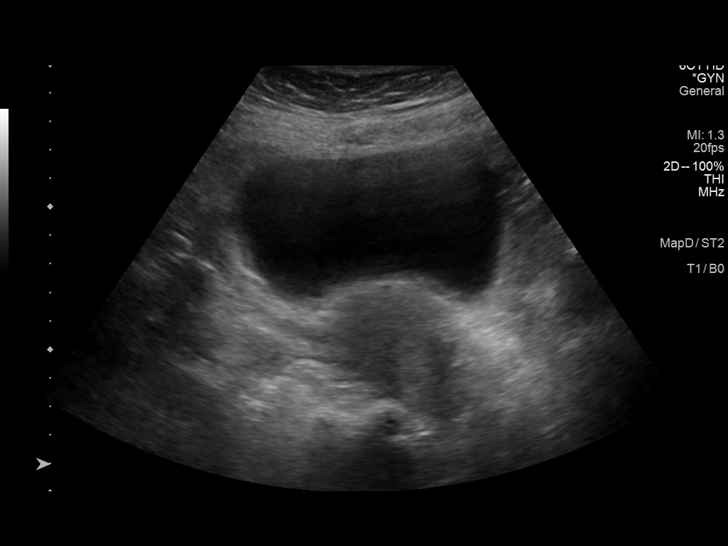
[im 17/65]
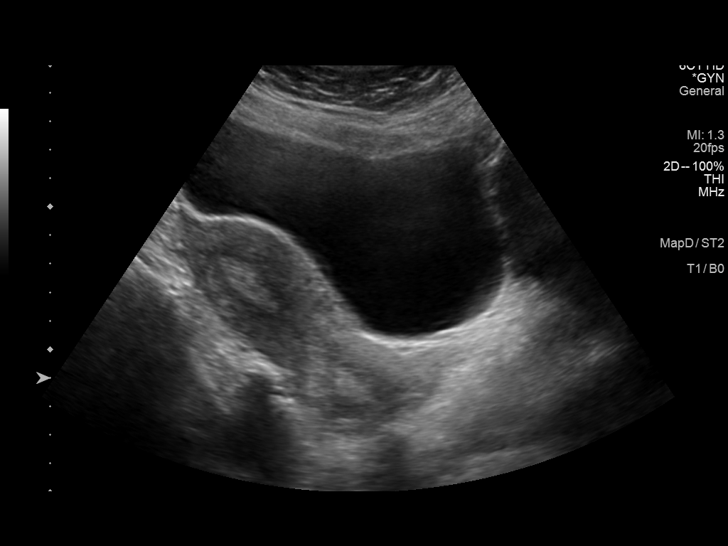
[im 22/65]
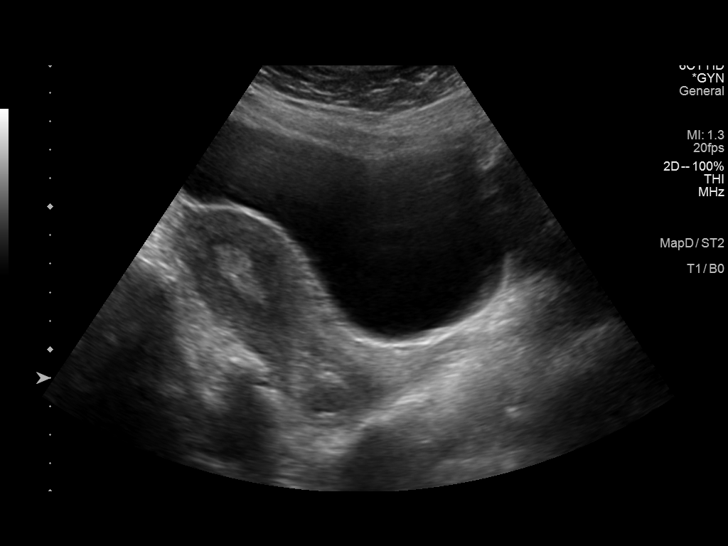
[im 27/65]
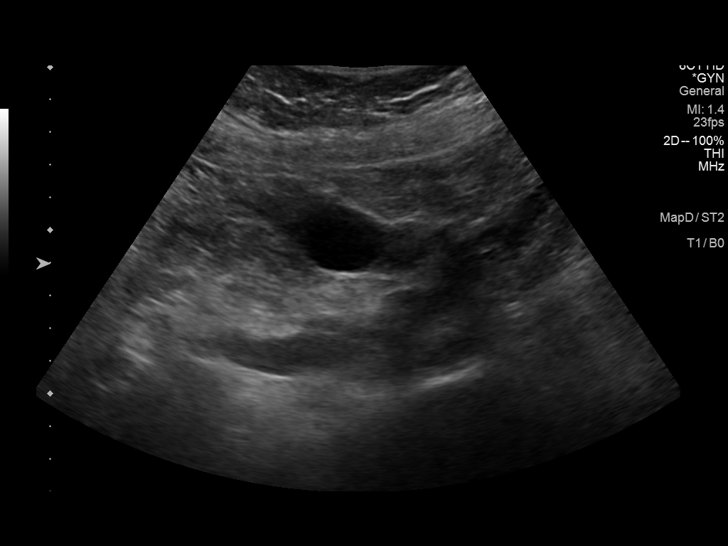
[im 33/65]
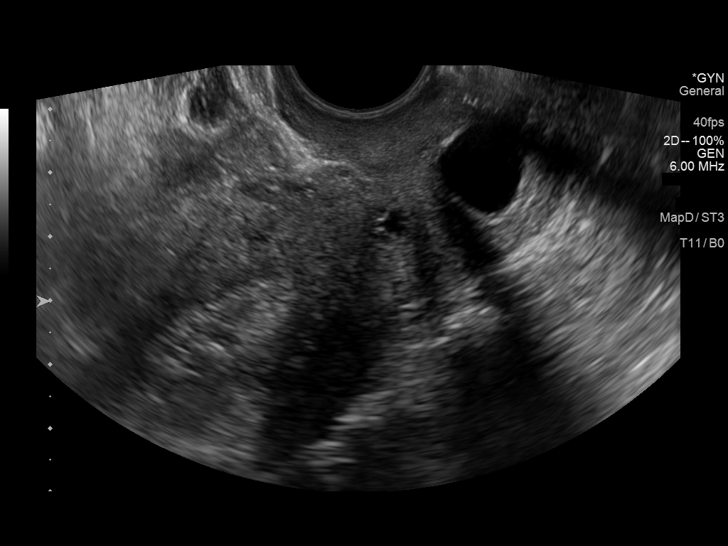
[im 38/65]
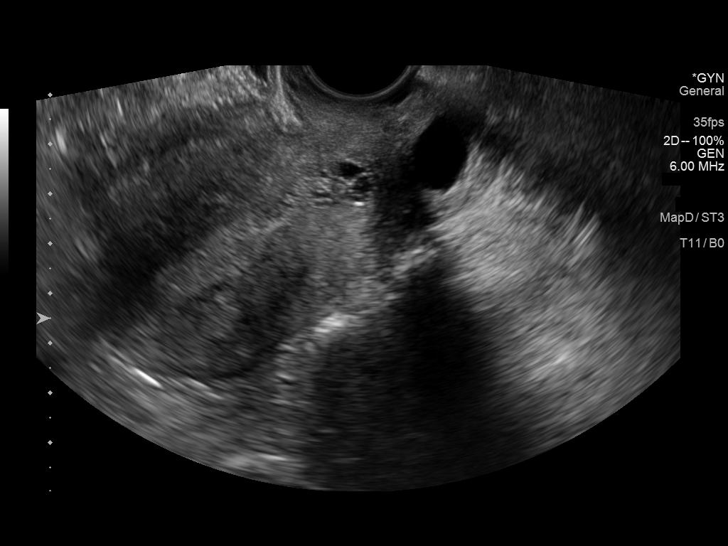
[im 43/65]
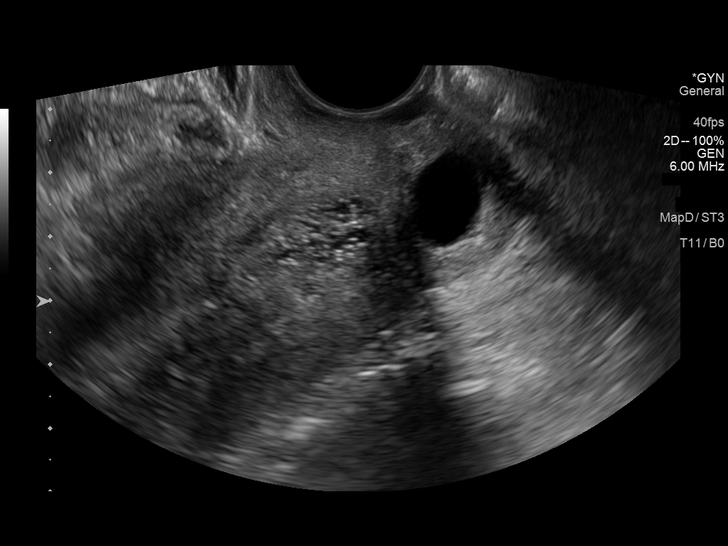
[im 49/65]
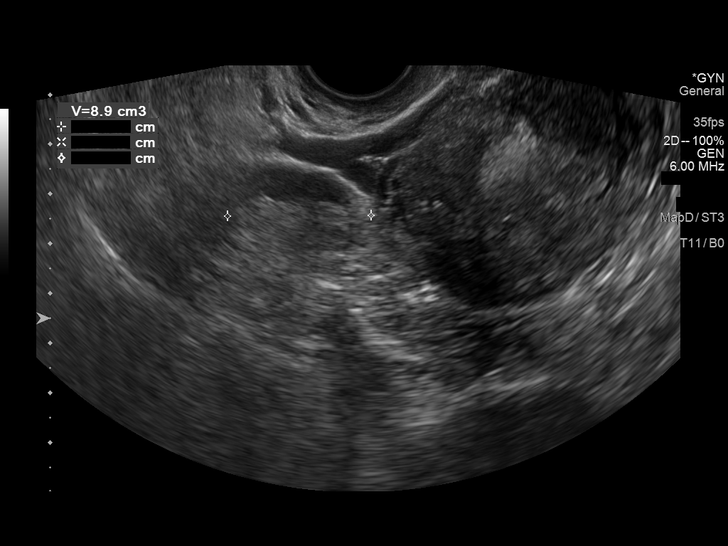
[im 54/65]
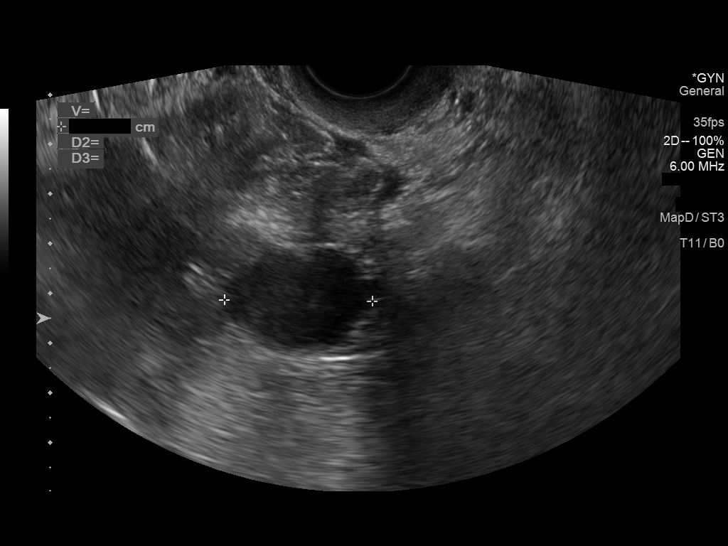
[im 59/65]
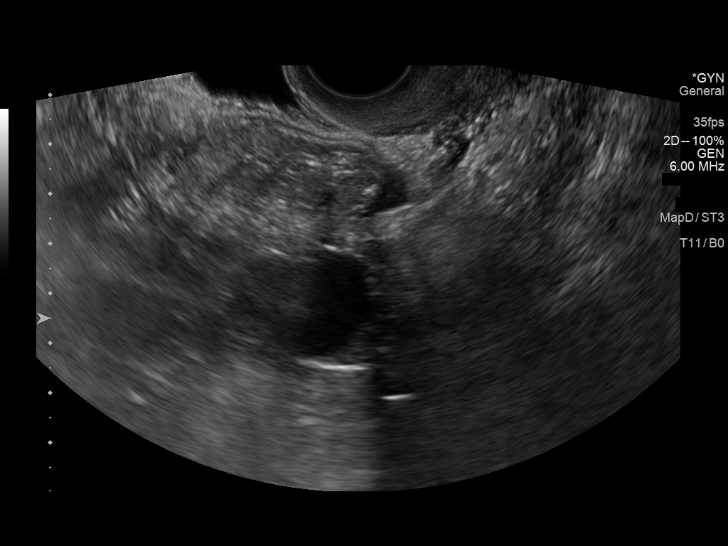
[im 65/65]
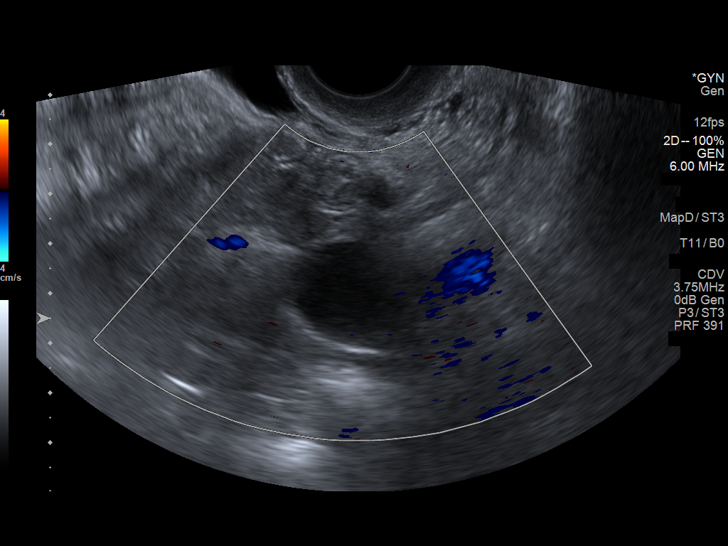

[13 of 25 positions shown; findings below may reference images not displayed]

FINDINGS: Uterus

Measurements: 9.7 x 4.0 x 6.3 cm = volume: 128 mL. Anteverted.
Nabothian cysts at cervix. No additional uterine mass

Endometrium

Thickness: 11 mm. No endometrial fluid. Area of heterogeneous
echogenicity at the lower uterine segment extending into the cervix
measuring 2 cm length by 10 mm diameter.

Right ovary

Measurements: 3.5 x 2.1 x 1.9 cm = volume: 7.1 mL. Normal morphology
without mass

Left ovary

Measurements: 2.8 x 3.1 x 2.2 cm = volume: 4.7 mL. Mildly
complicated cyst within LEFT ovary 3.0 x 2.3 x 3.0 cm containing
scattered low level internal echogenicity. No septations or mural
nodularity.

Other findings

No free pelvic fluid.  No adnexal masses.
IMPRESSION: Abnormal appearance of the endometrial complex measuring up to 11 mm
thick at the upper to mid uterus with an additional area of
heterogeneous thickening at the lower uterine segment extending into
the cervix.

Endometrial thickness is considered abnormal for an asymptomatic
post-menopausal female. Endometrial sampling recommended to exclude
carcinoma.

3.0 cm mildly complicated cyst within LEFT ovary, containing
scattered low level internal echoes; follow-up ultrasound
recommended in 6 months to establish stability.

These results will be called to the ordering clinician or
representative by the Radiologist Assistant, and communication
documented in the PACS or zVision Dashboard.

## 2021-02-11 ENCOUNTER — Ambulatory Visit: Payer: Commercial Managed Care - PPO | Admitting: Physician Assistant

## 2021-02-12 ENCOUNTER — Encounter: Payer: Self-pay | Admitting: Dermatology

## 2021-02-12 ENCOUNTER — Ambulatory Visit (INDEPENDENT_AMBULATORY_CARE_PROVIDER_SITE_OTHER): Payer: Commercial Managed Care - PPO | Admitting: Dermatology

## 2021-02-12 ENCOUNTER — Other Ambulatory Visit: Payer: Self-pay

## 2021-02-12 DIAGNOSIS — L570 Actinic keratosis: Secondary | ICD-10-CM

## 2021-02-12 DIAGNOSIS — Z1283 Encounter for screening for malignant neoplasm of skin: Secondary | ICD-10-CM | POA: Diagnosis not present

## 2021-02-12 NOTE — Patient Instructions (Signed)
1% hydrocortisone ointment for nose.

## 2021-02-16 ENCOUNTER — Encounter: Payer: Self-pay | Admitting: Dermatology

## 2021-02-16 NOTE — Progress Notes (Signed)
   New Patient   Subjective  Samantha Shannon is a 50 y.o. female who presents for the following: Annual Exam (Patient has a spot above left eye. X few weeks. ).  General skin check plus check crust near left inner eye. Location:  Duration:  Quality:  Associated Signs/Symptoms: Modifying Factors:  Severity:  Timing: Context:    The following portions of the chart were reviewed this encounter and updated as appropriate:  Tobacco  Allergies  Meds  Problems  Med Hx  Surg Hx  Fam Hx       Objective  Well appearing patient in no apparent distress; mood and affect are within normal limits. No atypical pigmented lesions or nonmelanoma skin cancer  Left Root of Nose Pink subtle hornlike 4 mm crust    A full examination was performed including scalp, head, eyes, ears, nose, lips, neck, chest, axillae, abdomen, back, buttocks, bilateral upper extremities, bilateral lower extremities, hands, feet, fingers, toes, fingernails, and toenails. All findings within normal limits unless otherwise noted below.  Areas beneath undergarments not fully examined.   Assessment & Plan  AK (actinic keratosis) Left Root of Nose  Follow up in 1 month if lesion does not go away.   Destruction of lesion - Left Root of Nose Complexity: simple   Destruction method: cryotherapy   Informed consent: discussed and consent obtained   Timeout:  patient name, date of birth, surgical site, and procedure verified Lesion destroyed using liquid nitrogen: Yes   Cryotherapy cycles:  3 Outcome: patient tolerated procedure well with no complications   Post-procedure details: wound care instructions given    Screening for malignant neoplasm of skin  Annual skin examination, encouraged to self examine twice annually.  Continued ultraviolet protection.

## 2021-03-11 ENCOUNTER — Other Ambulatory Visit: Payer: Self-pay

## 2021-03-11 ENCOUNTER — Ambulatory Visit (INDEPENDENT_AMBULATORY_CARE_PROVIDER_SITE_OTHER): Payer: Commercial Managed Care - PPO | Admitting: Dermatology

## 2021-03-11 ENCOUNTER — Encounter: Payer: Self-pay | Admitting: Dermatology

## 2021-03-11 DIAGNOSIS — L570 Actinic keratosis: Secondary | ICD-10-CM

## 2021-03-23 ENCOUNTER — Encounter: Payer: Self-pay | Admitting: Dermatology

## 2021-03-23 NOTE — Progress Notes (Signed)
   Follow-Up Visit   Subjective  Samantha Shannon is a 50 y.o. female who presents for the following: Follow-up (Are left inner eye area is now flat but discolored ).  Actinic keratosis left medial canthus Location:  Duration:  Quality:  Associated Signs/Symptoms: Modifying Factors: LN2 freeze Severity:  Timing: Context:   Objective  Well appearing patient in no apparent distress; mood and affect are within normal limits. Left Medial Canthus Lesion clear today after LN2.  Currently no other suspicious lesions on head and neck.    A focused examination was performed including head and neck.. Relevant physical exam findings are noted in the Assessment and Plan.   Assessment & Plan    AK (actinic keratosis) Left Medial Canthus  Annual skin examination      I, Lavonna Monarch, MD, have reviewed all documentation for this visit.  The documentation on 03/23/21 for the exam, diagnosis, procedures, and orders are all accurate and complete.

## 2021-06-19 ENCOUNTER — Other Ambulatory Visit: Payer: Self-pay | Admitting: Obstetrics and Gynecology

## 2021-06-19 ENCOUNTER — Other Ambulatory Visit: Payer: Self-pay

## 2021-06-19 DIAGNOSIS — R102 Pelvic and perineal pain: Secondary | ICD-10-CM

## 2021-06-21 ENCOUNTER — Other Ambulatory Visit: Payer: Commercial Managed Care - PPO

## 2021-07-02 ENCOUNTER — Ambulatory Visit
Admission: RE | Admit: 2021-07-02 | Discharge: 2021-07-02 | Disposition: A | Discharge status: Home / Self Care | Payer: Commercial Managed Care - PPO | Source: Ambulatory Visit | Attending: Obstetrics and Gynecology | Admitting: Obstetrics and Gynecology

## 2021-07-02 DIAGNOSIS — R102 Pelvic and perineal pain: Secondary | ICD-10-CM

## 2022-03-11 ENCOUNTER — Ambulatory Visit: Payer: Commercial Managed Care - PPO | Admitting: Dermatology

## 2022-03-13 ENCOUNTER — Ambulatory Visit (INDEPENDENT_AMBULATORY_CARE_PROVIDER_SITE_OTHER): Payer: Commercial Managed Care - PPO | Admitting: Dermatology

## 2022-03-13 DIAGNOSIS — D1801 Hemangioma of skin and subcutaneous tissue: Secondary | ICD-10-CM | POA: Diagnosis not present

## 2022-03-13 DIAGNOSIS — L814 Other melanin hyperpigmentation: Secondary | ICD-10-CM

## 2022-03-13 DIAGNOSIS — Z1283 Encounter for screening for malignant neoplasm of skin: Secondary | ICD-10-CM

## 2022-04-06 ENCOUNTER — Encounter: Payer: Self-pay | Admitting: Dermatology

## 2022-04-06 NOTE — Progress Notes (Signed)
   Follow-Up Visit   Subjective  Samantha Shannon is a 51 y.o. female who presents for the following: Annual Exam (Here for annual skin exam.).  Skin examination, no areas of concern Location:  Duration:  Quality:  Associated Signs/Symptoms: Modifying Factors:  Severity:  Timing: Context:   Objective  Well appearing patient in no apparent distress; mood and affect are within normal limits. No atypical nevi or signs of NMSC noted at the time of the visit. [place]igo on the lower legs, fall injury on the right knee, left abdomen isk, angiomas on the torso.    A full examination was performed including scalp, head, eyes, ears, nose, lips, neck, chest, axillae, abdomen, back, buttocks, bilateral upper extremities, bilateral lower extremities, hands, feet, fingers, toes, fingernails, and toenails. All findings within normal limits unless otherwise noted below.  Areas beneath undergarments not fully examined.   Assessment & Plan    Screening exam for skin cancer  Yearly skin exams, encouraged to self examine twice annually.  Continued ultraviolet protection.      I, Lavonna Monarch, MD, have reviewed all documentation for this visit.  The documentation on 04/06/22 for the exam, diagnosis, procedures, and orders are all accurate and complete.

## 2022-05-14 IMAGING — US US PELVIS COMPLETE WITH TRANSVAGINAL
1 series · 14 of 25 positions shown · non-contrast
Comparison: None

CLINICAL DATA: Pelvic pain.  Postmenopausal.



[Series 1: us pelvis complete with transvaginal · 0.28mm/px · 14 of 53 slices shown]
[im 1/53]
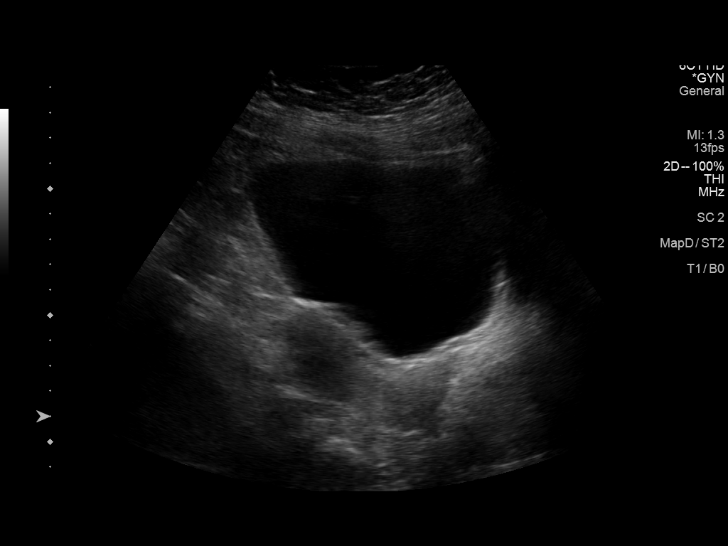
[im 5/53]
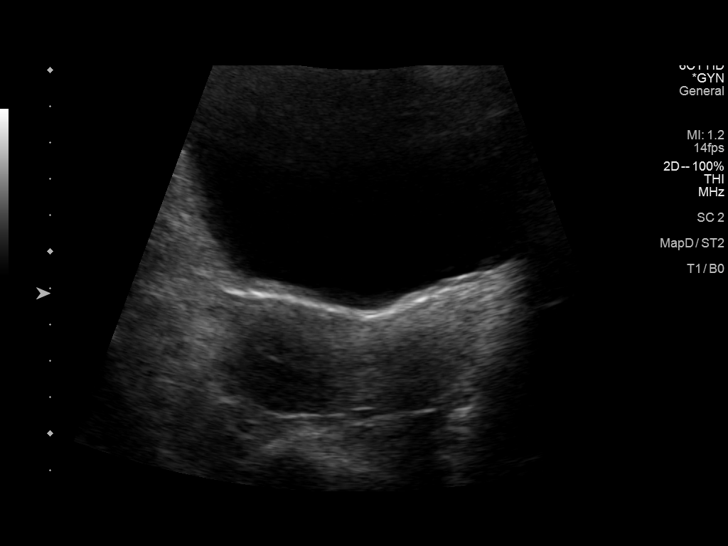
[im 9/53]
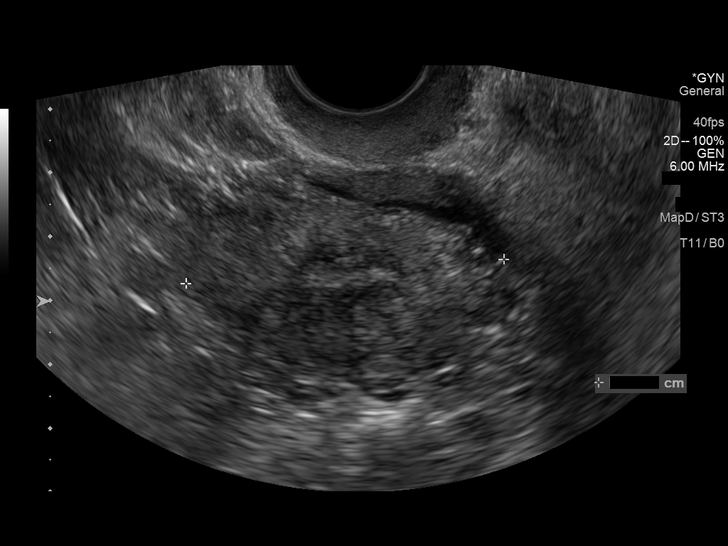
[im 14/53]
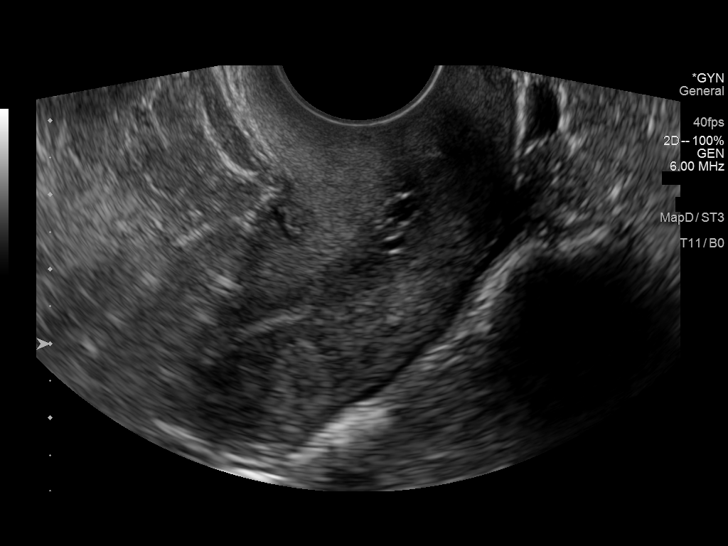
[im 18/53]
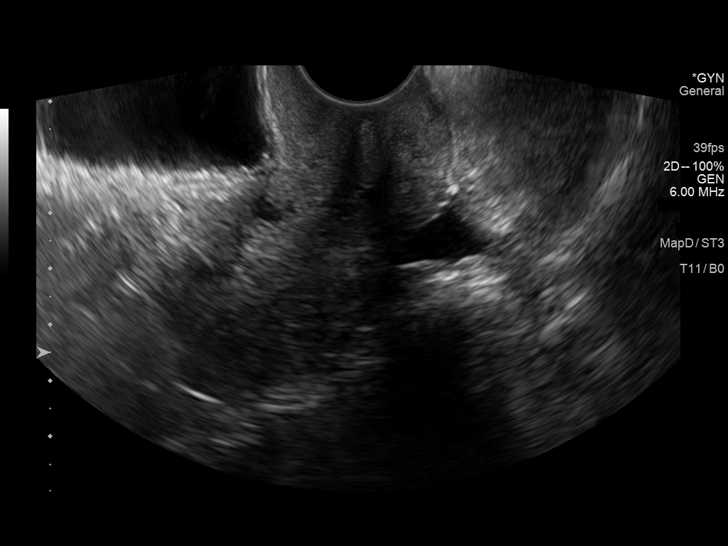
[im 20/53]
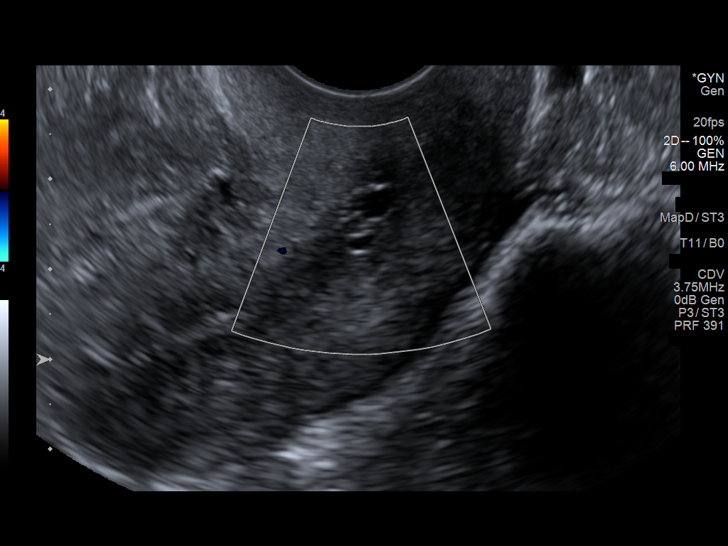
[im 24/53]
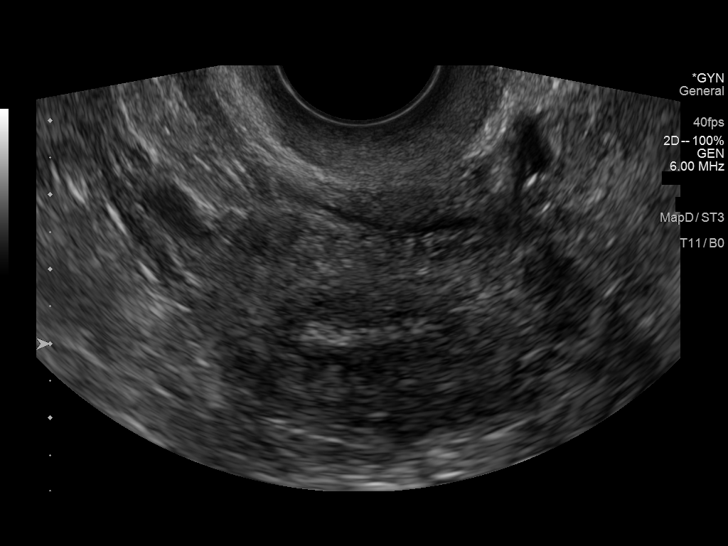
[im 29/53]
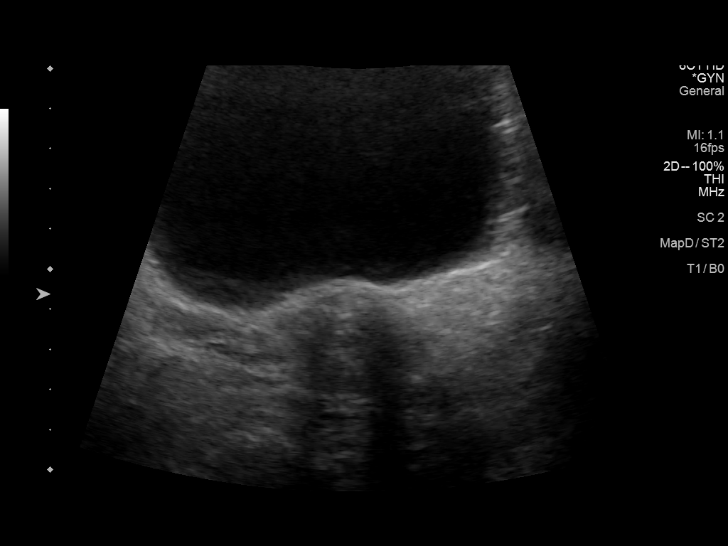
[im 33/53]
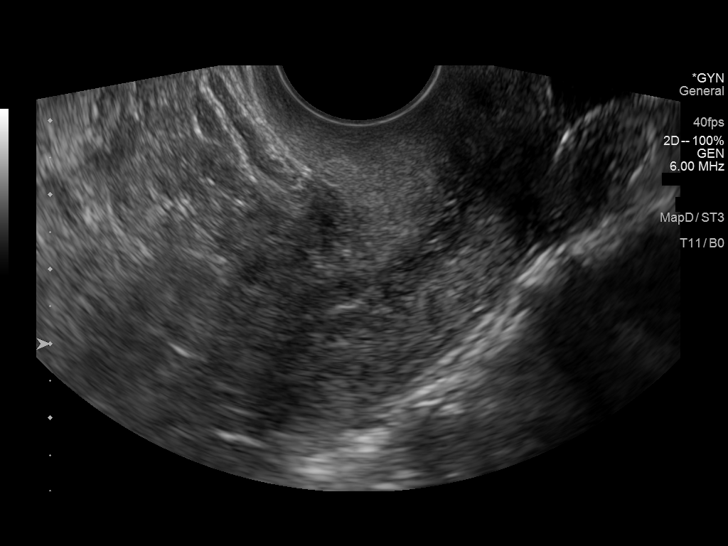
[im 35/53]
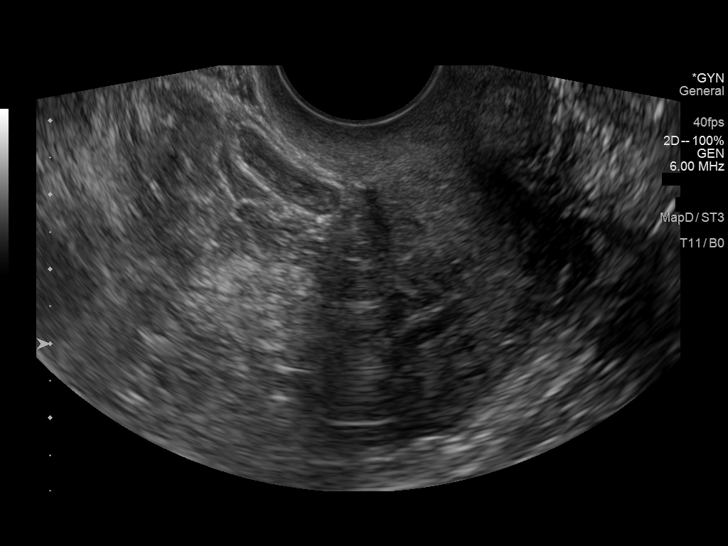
[im 40/53]
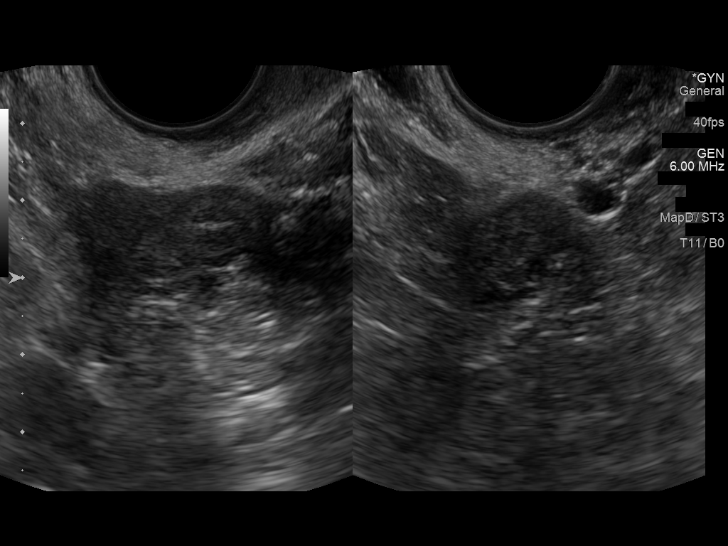
[im 44/53]
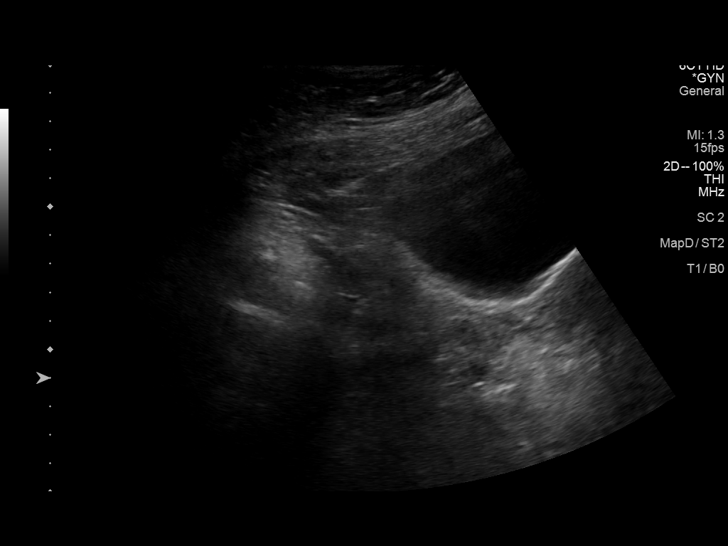
[im 48/53]
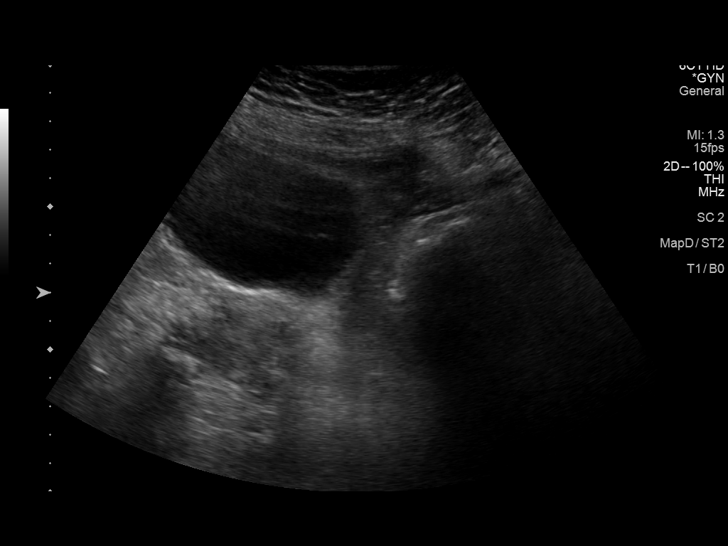
[im 53/53]
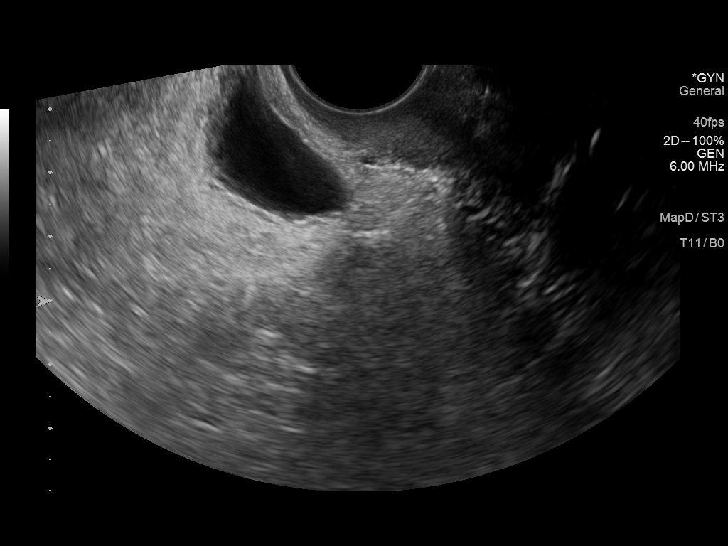

[14 of 25 positions shown; findings below may reference images not displayed]

FINDINGS: Uterus

Measurements: 7.0 x 2.7 x 5.0 cm = volume: 50 mL. No fibroids or
other mass visualized. Several nabothian cysts are seen in the
region of the internal cervical os along the endocervical canal. The
cervix is otherwise unremarkable.

Endometrium

Thickness: 2-3 mm.  No focal abnormality visualized.

Right ovary

Measurements: 2.7 x 1.4 x 1.2 cm = volume: 2 mL. Normal
appearance/no adnexal mass.

Left ovary

Not visualized.  No adnexal mass identified.

Other findings

Small simple appearing free fluid noted within the cul-de-sac.
IMPRESSION: Nonvisualization of the left ovary. Otherwise normal pelvic
sonogram.

## 2022-09-22 ENCOUNTER — Other Ambulatory Visit: Payer: Self-pay | Admitting: Obstetrics & Gynecology

## 2022-09-22 DIAGNOSIS — M958 Other specified acquired deformities of musculoskeletal system: Secondary | ICD-10-CM
